# Patient Record
Sex: Male | Born: 1955 | Race: Black or African American | Hispanic: No | Marital: Single | Smoking: Never smoker
Health system: Southern US, Community
[De-identification: ages and names within clinical notes are randomized; demographics above are authoritative.]

---

## 2017-10-01 ENCOUNTER — Encounter: Payer: Self-pay | Admitting: Emergency Medicine

## 2017-10-01 ENCOUNTER — Emergency Department: Payer: Medicare Other

## 2017-10-01 ENCOUNTER — Emergency Department
Admission: EM | Admit: 2017-10-01 | Discharge: 2017-10-01 | Disposition: A | Discharge status: Home / Self Care | Payer: Medicare Other | Attending: Emergency Medicine | Admitting: Emergency Medicine

## 2017-10-01 DIAGNOSIS — Y939 Activity, unspecified: Secondary | ICD-10-CM | POA: Insufficient documentation

## 2017-10-01 DIAGNOSIS — Y92129 Unspecified place in nursing home as the place of occurrence of the external cause: Secondary | ICD-10-CM | POA: Insufficient documentation

## 2017-10-01 DIAGNOSIS — S0993XA Unspecified injury of face, initial encounter: Secondary | ICD-10-CM | POA: Diagnosis present

## 2017-10-01 DIAGNOSIS — Y999 Unspecified external cause status: Secondary | ICD-10-CM | POA: Insufficient documentation

## 2017-10-01 DIAGNOSIS — W19XXXA Unspecified fall, initial encounter: Secondary | ICD-10-CM

## 2017-10-01 DIAGNOSIS — W050XXA Fall from non-moving wheelchair, initial encounter: Secondary | ICD-10-CM | POA: Insufficient documentation

## 2017-10-01 DIAGNOSIS — Z79899 Other long term (current) drug therapy: Secondary | ICD-10-CM | POA: Insufficient documentation

## 2017-10-01 DIAGNOSIS — S0083XA Contusion of other part of head, initial encounter: Secondary | ICD-10-CM | POA: Insufficient documentation

## 2017-10-01 DIAGNOSIS — F0390 Unspecified dementia without behavioral disturbance: Secondary | ICD-10-CM | POA: Diagnosis not present

## 2017-10-01 MED ORDER — KETOROLAC TROMETHAMINE 60 MG/2ML IM SOLN
15.0000 mg | Freq: Once | INTRAMUSCULAR | Status: AC
  Administered 2017-10-01: 15 mg via INTRAMUSCULAR
  Filled 2017-10-01: qty 2

## 2017-10-01 NOTE — Discharge Instructions (Signed)
Your CT scan of the head and neck did not show any serious acute injuries today.  Continue taking your medications and follow up with your doctor this week to continue monitoring your symptoms.

## 2017-10-01 NOTE — ED Provider Notes (Signed)
[facility] Provider Note  ____________________________________________  Time seen: Approximately 9:40 PM  I have reviewed the triage vital signs and the nursing notes.   HISTORY  Chief Complaint Fall  Level 5 Caveat: Portions of the History and Physical were unable to be obtained due to altered mental status due to advanced dementia.   HPI Daniel [facility]erra is a 61 y.o. male sent to the ED due to a reported fall this [facility]. Patient was sitting in a wheelchair, not secured, fell forward out of the wheelchair and hit his face on the ground. No LOC or vomiting. Fall was witnessed. No seizure activity is reported. Patient was kept there at [facility] throughout the day and then sent to the ED this evening for evaluation. He fell onto a carpeted surface and is reported to have a "rug burn" on his left forehead. Doesn't take anticoagulants.     History reviewed. No pertinent past medical history. Dementia Seizures Depression  There are no active problems to display for this patient.    History reviewed. No pertinent surgical history.   Prior to Admission medications   Medication Sig Start Date End Date Taking? Authorizing Provider  Cholecalciferol (VITAMIN D3 SUPER STRENGTH) 2000 units TABS Take 2,000 Units by mouth daily.   Yes [provider]  docusate sodium (COLACE) 100 MG capsule Take 100 mg by mouth 2 (two) times daily.   Yes [provider]  levETIRAcetam (KEPPRA) 100 MG/ML solution Take 1,500 mg by mouth 2 (two) times daily.   Yes [provider]  loratadine (CLARITIN) 10 MG tablet Take 10 mg by mouth daily.   Yes [provider]  polyethylene glycol (MIRALAX / GLYCOLAX) packet Take 17 g by mouth daily.   Yes [provider]  sertraline (ZOLOFT) 50 MG tablet Take 50 mg by mouth daily.   Yes [provider]     Allergies Patient has no known  allergies.   History reviewed. No pertinent family history.  Social History Social History  Substance Use Topics  . Smoking status: Never Smoker  . Smokeless tobacco: Never Used  . Alcohol use No    Review of Systems Unable to reliably obtained due to altered mental status.  ____________________________________________   PHYSICAL EXAM:  VITAL SIGNS: ED Triage Vitals  Enc Vitals Group     BP 10/01/17 2001 128/77     Pulse Rate 10/01/17 2001 (!) 107     Resp 10/01/17 2001 18     Temp 10/01/17 2001 98.7 F (37.1 C)     Temp Source 10/01/17 2001 Oral     SpO2 10/01/17 2001 100 %     Weight 10/01/17 2002 198 lb (89.8 kg)     Height 10/01/17 2002 6\' 1"  (1.854 m)     Head Circumference --      Peak Flow --      Pain Score --      Pain Loc --      Pain Edu? --      Excl. in GC? --     Vital signs reviewed, nursing assessments reviewed.   Constitutional:  Awake, not alert or oriented. Not in distress. Eyes:   No scleral icterus.  EOMI. PERRL. ENT   Head:   Normocephalic with contusion and abrasion of the left forehead   Nose:   No congestion/rhinnorhea. Axis   Mouth/Throat:   MMM, no pharyngeal erythema. No peritonsillar mass. No intraoral injuries   Neck:  No meningismus. Full ROM. No midline spinal tenderness Hematological/Lymphatic/Immunilogical:   No cervical lymphadenopathy. Cardiovascular:   RRR. Symmetric bilateral radial and DP pulses.  No murmurs.  Respiratory:   Normal respiratory effort without tachypnea/retractions. Breath sounds are clear and equal bilaterally. No wheezes/rales/rhonchi. Gastrointestinal:   Soft and nontender. Non distended. There is no CVA tenderness.  No rebound, rigidity, or guarding. Genitourinary:   deferred Musculoskeletal:   Normal range of motion in all extremities. No joint effusions.  No lower extremity tenderness.  No edema. Neurologic:   Nonverbal.  Contractures and extremities No acute focal neurologic deficits  are appreciated.  Skin:    Skin is warm, dry and intact. No rash noted.  No petechiae, purpura, or bullae.  ____________________________________________    LABS (pertinent positives/negatives) (all labs ordered are listed, but only abnormal results are displayed) Labs Reviewed - No data to display ____________________________________________   EKG    ____________________________________________    RADIOLOGY  Ct Head Wo Contrast  Result Date: 10/01/2017 CLINICAL DATA:  Pt sustained the fall some time in the morning and they saw that the pt fell forward from his wheel chair while the Pt was rocking back and forward. According to the witness the Pt fell and hit the floor with his head, no LOC was witnessed EXAM: CT HEAD WITHOUT CONTRAST CT CERVICAL SPINE WITHOUT CONTRAST TECHNIQUE: Multidetector CT imaging of the head and cervical spine was performed following the standard protocol without intravenous contrast. Multiplanar CT image reconstructions of the cervical spine were also generated. COMPARISON:  None. FINDINGS: CT HEAD FINDINGS Brain: No intracranial hemorrhage. No parenchymal contusion. No midline shift or mass effect. Basilar cisterns are patent. No skull base fracture. No fluid in the paranasal sinuses or mastoid air cells. Orbits are normal. Frontotemporal atrophy and ventriculomegaly. The degree of ventriculomegaly appears slightly disproportionate atrophy. Vascular: No hyperdense vessel or unexpected calcification. Skull: Normal. Negative for fracture or focal lesion. Sinuses/Orbits: Paranasal sinuses and mastoid air cells are clear. Orbits are clear. Other: None. CT CERVICAL SPINE FINDINGS Alignment: Focal kyphosis in the mid cervical spine appears chronic. Skull base and vertebrae: Normal craniocervical junction. No loss of bowel vertebral body height or disc height. Normal facet articulation. No evidence of fracture. Soft tissues and spinal canal: No prevertebral soft tissue  swelling. No perispinal or epidural hematoma. Disc levels: Multiple levels of endplate spurring and joint space narrowing from C3-C6. Upper chest: Clear Other: None IMPRESSION: 1. No acute intracranial findings.  No evidence trauma. 2. Marked ventriculomegaly and frontal cortical atrophy. 3. No evidence cervical spine fracture. 4. Multilevel disc osteophytic disease. 5. Focal kyphosis in the mid cervical spine appears chronic. Electronically Signed   By: Genevive Bi M.D.   On: 10/01/2017 21:02   Ct Cervical Spine Wo Contrast  Result Date: 10/01/2017 CLINICAL DATA:  Pt sustained the fall some time in the morning and they saw that the pt fell forward from his wheel chair while the Pt was rocking back and forward. According to the witness the Pt fell and hit the floor with his head, no LOC was witnessed EXAM: CT HEAD WITHOUT CONTRAST CT CERVICAL SPINE WITHOUT CONTRAST TECHNIQUE: Multidetector CT imaging of the head and cervical spine was performed following the standard protocol without intravenous contrast. Multiplanar CT image reconstructions of the cervical spine were also generated. COMPARISON:  None. FINDINGS: CT HEAD FINDINGS Brain: No intracranial hemorrhage. No parenchymal contusion. No midline shift or mass effect. Basilar cisterns are patent. No skull base fracture. No fluid in  the paranasal sinuses or mastoid air cells. Orbits are normal. Frontotemporal atrophy and ventriculomegaly. The degree of ventriculomegaly appears slightly disproportionate atrophy. Vascular: No hyperdense vessel or unexpected calcification. Skull: Normal. Negative for fracture or focal lesion. Sinuses/Orbits: Paranasal sinuses and mastoid air cells are clear. Orbits are clear. Other: None. CT CERVICAL SPINE FINDINGS Alignment: Focal kyphosis in the mid cervical spine appears chronic. Skull base and vertebrae: Normal craniocervical junction. No loss of bowel vertebral body height or disc height. Normal facet articulation. No  evidence of fracture. Soft tissues and spinal canal: No prevertebral soft tissue swelling. No perispinal or epidural hematoma. Disc levels: Multiple levels of endplate spurring and joint space narrowing from C3-C6. Upper chest: Clear Other: None IMPRESSION: 1. No acute intracranial findings.  No evidence trauma. 2. Marked ventriculomegaly and frontal cortical atrophy. 3. No evidence cervical spine fracture. 4. Multilevel disc osteophytic disease. 5. Focal kyphosis in the mid cervical spine appears chronic. Electronically Signed   By: Genevive BiStewart  Edmunds M.D.   On: 10/01/2017 21:02    ____________________________________________   PROCEDURES Procedures  ____________________________________________   DIFFERENTIAL DIAGNOSIS  Stroke, intracranial hemorrhage, cervical spine fracture, skull fracture   CLINICAL IMPRESSION / ASSESSMENT AND PLAN / ED COURSE  Pertinent labs & imaging results that were available during my care of the patient were reviewed by me and considered in my medical decision making (see chart for details).   Patient not in distress, unremarkable vital signs except for slight tachycardia at triage, sent to the ED for evaluation of head injury. Patient is unable to provide any history or participate in exam. CT scans of head and neck unremarkable. Suitable for discharge home and outpatient follow-up. I doubt that the patient has an acute infectious process such as pneumonia or urinary tract infection soft tissue infection or meningitis. I doubt sepsis shock or acute blood loss. No evidence of any other traumatic injury.      ____________________________________________   FINAL CLINICAL IMPRESSION(S) / ED DIAGNOSES    Final diagnoses:  Contusion of face, initial encounter  Fall, initial encounter      New Prescriptions   No medications on file     Portions of this note were generated with dragon dictation software. Dictation errors may occur despite best attempts  at proofreading.    Sharman CheekStafford, Latoshia Monrroy, MD 10/01/17 2144

## 2017-10-01 NOTE — ED Triage Notes (Signed)
Pt arrived to the ED via EMS from [facility] to be evaluated for a fall. According to EMS a family member for another patient at [facility] told them that the Pt sustained the fall some time in the morning and they saw that the pt fell forward from his wheel chair while the Pt was rocking back and forward. According to the witness the Pt fell and hit the floor with his head, no LOC was witnessed. Pt is Alert upon arrival to the hospital but alertness to time and place could not be verified since the Pt is non-verbal due to advanced dementia. Family is at bedside.

## 2018-07-10 ENCOUNTER — Emergency Department (HOSPITAL_COMMUNITY): Payer: Medicare Other

## 2018-07-10 ENCOUNTER — Emergency Department (HOSPITAL_COMMUNITY)
Admission: EM | Admit: 2018-07-10 | Discharge: 2018-07-10 | Disposition: A | Discharge status: Home / Self Care | Payer: Medicare Other | Attending: Emergency Medicine | Admitting: Emergency Medicine

## 2018-07-10 ENCOUNTER — Encounter (HOSPITAL_COMMUNITY): Payer: Self-pay

## 2018-07-10 ENCOUNTER — Other Ambulatory Visit: Payer: Self-pay

## 2018-07-10 DIAGNOSIS — Z79899 Other long term (current) drug therapy: Secondary | ICD-10-CM | POA: Diagnosis not present

## 2018-07-10 DIAGNOSIS — Y939 Activity, unspecified: Secondary | ICD-10-CM | POA: Diagnosis not present

## 2018-07-10 DIAGNOSIS — Y999 Unspecified external cause status: Secondary | ICD-10-CM | POA: Insufficient documentation

## 2018-07-10 DIAGNOSIS — Y92122 Bedroom in nursing home as the place of occurrence of the external cause: Secondary | ICD-10-CM | POA: Diagnosis not present

## 2018-07-10 DIAGNOSIS — S0990XA Unspecified injury of head, initial encounter: Secondary | ICD-10-CM | POA: Diagnosis present

## 2018-07-10 DIAGNOSIS — S022XXA Fracture of nasal bones, initial encounter for closed fracture: Secondary | ICD-10-CM

## 2018-07-10 DIAGNOSIS — G309 Alzheimer's disease, unspecified: Secondary | ICD-10-CM | POA: Diagnosis not present

## 2018-07-10 DIAGNOSIS — S0181XA Laceration without foreign body of other part of head, initial encounter: Secondary | ICD-10-CM | POA: Insufficient documentation

## 2018-07-10 DIAGNOSIS — W06XXXA Fall from bed, initial encounter: Secondary | ICD-10-CM | POA: Insufficient documentation

## 2018-07-10 DIAGNOSIS — W19XXXA Unspecified fall, initial encounter: Secondary | ICD-10-CM

## 2018-07-10 LAB — CBG MONITORING, ED: Glucose-Capillary: 82 mg/dL (ref 70–99)

## 2018-07-10 MED ORDER — MORPHINE SULFATE (PF) 4 MG/ML IV SOLN
4.0000 mg | Freq: Once | INTRAVENOUS | Status: AC
  Administered 2018-07-10: 4 mg via INTRAMUSCULAR
  Filled 2018-07-10: qty 1

## 2018-07-10 NOTE — ED Notes (Signed)
Got patient on the monitor did vitals patient is resting with call bell in reach and nurse at bedside

## 2018-07-10 NOTE — ED Notes (Signed)
Pt daughter will like to be contacted when patient leaves with [facility]. Phone is on board in pts room.

## 2018-07-10 NOTE — ED Notes (Signed)
Pt unable to sign  

## 2018-07-10 NOTE — ED Notes (Signed)
Pt incontinent of stool; pt cleaned w/ soap and water,dried, brief changed

## 2018-07-10 NOTE — ED Notes (Signed)
[facility]

## 2018-07-10 NOTE — ED Triage Notes (Signed)
Pt from [facility] via ems; per staff, CNA was getting pt up from bed when pt fell face forward, striking forehead on floor; unable to establish if pt had loc; per staff, pt is at baseline; nonverbal, arms and legs contracted; c-collar in place; pt's upper lip swollen; Hx alzheimer's, aphasia, contracture, epilepsy; per staff, pt is prescribed percocet, pt received 3.5 mg prior to ems arrival  123/78 HR 87 98% RA CBG 95 RR 18

## 2018-07-10 NOTE — ED Notes (Signed)
Pt CBG 82. Notified Hannie, RN.

## 2018-07-10 NOTE — ED Notes (Signed)
Pt alert in NAD. Report called to facility. Transported by [facility]

## 2018-07-10 NOTE — ED Notes (Signed)
Waiting on PTAR... 

## 2018-07-10 NOTE — ED Provider Notes (Signed)
[facility] EMERGENCY [facility]PARTMENT Provider Note   CSN: [id number] Arrival date & time: 07/10/18  1154     History   Chief Complaint Chief Complaint  Patient presents with  . Fall  . Laceration    HPI Daniel [facility] is a 62 y.o. male.  Level 5 caveat secondary to nonverbal.  [facility]  He was transferred here after falling out of bed while staff was attempting to get him changed up.  Per EMS report he fell forward struck his forehead on the floor.  Unclear if there was LOC.  Patient baseline nonverbal and history is being provided by the note and also his sisters are here.  They say at baseline he is nonverbal noncommunicative contracted and bedbound.  The only thing that they are noticing is a small laceration on his forehead, swollen reddened nose, some bleeding from his mouth, and abrasion on his right knee.  Otherwise they feel that he is at his baseline.  The history is provided by the EMS personnel and a relative. The history is limited by a language barrier.  Fall  This is a new problem. The current episode started 1 to 2 hours ago.  Laceration      History reviewed. No pertinent past medical history.  There are no active problems to display for this patient.   History reviewed. No pertinent surgical history.      Home Medications    Prior to Admission medications   Medication Sig Start Date End Date Taking? Authorizing Provider  bisacodyl (DULCOLAX) 10 MG suppository Place 10 mg rectally as needed for mild constipation.   Yes [provider]  levETIRAcetam (KEPPRA) 100 MG/ML solution Take 1,500 mg by mouth 2 (two) times daily.   Yes [provider]  LORazepam (ATIVAN) 0.5 MG tablet Take 0.5 mg by mouth every 12 (twelve) hours.   Yes [provider]  oxyCODONE-acetaminophen (PERCOCET/ROXICET) 5-325 MG tablet Take 1 tablet by mouth every 6 (six) hours. Take 1 tablet by mouth every 3  hours as needed for  pain   Yes [provider]  senna-docusate (SENOKOT-S) 8.6-50 MG tablet Take 1 tablet by mouth 2 (two) times daily.   Yes [provider]    Family History No family history on file.  Social History Social History   Tobacco Use  . Smoking status: Never Smoker  . Smokeless tobacco: Never Used  Substance Use Topics  . Alcohol use: No  . Drug use: Not on file     Allergies   Patient has no known allergies.   Review of Systems Review of Systems  Unable to perform ROS: Patient nonverbal     Physical Exam Updated Vital Signs BP 128/90   Pulse (!) 103   Temp 98.5 F (36.9 C) (Oral)   Resp 19   SpO2 100%   Physical Exam  Constitutional: He appears well-developed and well-nourished.  HENT:  Head: Normocephalic.  Right Ear: External ear normal.  Left Ear: External ear normal.  He is a small abrasion on his forehead with no active bleeding.  There is no head or skull crepitus.  the bridge of his nose is swollen and reddened.  There is no active nosebleed.  He has some dried blood in his mouth but I am unable to open his mouth to assess if there is any other lacerations.  He is grinding his teeth.  Eyes: Conjunctivae are normal.  Neck: Neck supple.  Cardiovascular: Regular rhythm, normal heart sounds and intact distal pulses.  Pulmonary/Chest: Effort normal. He has no wheezes. He has no rales.  Abdominal: Soft. He exhibits no mass. There is no guarding.  Musculoskeletal:  His upper and left lower extremities are contracted small abrasion over his right knee.  No obvious deformities.  Neurological:  Patient is awake but noncommunicative not following any commands.  He is got upper and lower extremity contractures.  This is baseline per his sisters.  Skin: Skin is warm and dry.  Psychiatric: He has a normal mood and affect.  Nursing note and vitals reviewed.    ED Treatments / Results  Labs (all labs ordered are listed, but only  abnormal results are displayed) Labs Reviewed  CBG MONITORING, ED    EKG None  Radiology Ct Head Wo Contrast  Result Date: 07/10/2018 CLINICAL DATA:  Larey Seat from the bed striking the face and head. EXAM: CT HEAD WITHOUT CONTRAST CT MAXILLOFACIAL WITHOUT CONTRAST CT CERVICAL SPINE WITHOUT CONTRAST TECHNIQUE: Multidetector CT imaging of the head, cervical spine, and maxillofacial structures were performed using the standard protocol without intravenous contrast. Multiplanar CT image reconstructions of the cervical spine and maxillofacial structures were also generated. COMPARISON:  10/01/2017 FINDINGS: CT HEAD FINDINGS Brain: Generalized brain atrophy. Extensive chronic small-vessel ischemic changes of the cerebral hemispheric white matter with ex vacuo enlargement the lateral ventricles. No sign of acute infarction, mass lesion, hemorrhage, hydrocephalus or extra-axial collection. Vascular: No abnormal vascular finding. Skull: No skull fracture. Other: Forehead soft tissue swelling. CT MAXILLOFACIAL FINDINGS Osseous: Minor nasal fracture.  No other facial fracture. Orbits: Negative Sinuses: Chronic inflammatory changes of the right maxillary sinus. Soft tissues: Soft tissue swelling of the nose. CT CERVICAL SPINE FINDINGS Alignment: Kyphotic curvature of the spine. No traumatic malalignment. Skull base and vertebrae: No fracture or focal bone lesion. Soft tissues and spinal canal: Negative Disc levels: Chronic fusion at C4-5. Ordinary spondylosis at C2-3, C3-4 and C5-6. Upper chest: Negative Other: None IMPRESSION: Head CT: No acute or traumatic finding. Pronounced generalized atrophy, unchanged since the previous exam. Facial CT: Nasal fractures. No other facial fracture. Chronic right maxillary sinus inflammation. Cervical spine CT: No acute or traumatic finding. Chronic spondylosis. Electronically Signed   By: Paulina Fusi M.D.   On: 07/10/2018 15:29   Ct Cervical Spine Wo Contrast  Result Date:  07/10/2018 CLINICAL DATA:  Larey Seat from the bed striking the face and head. EXAM: CT HEAD WITHOUT CONTRAST CT MAXILLOFACIAL WITHOUT CONTRAST CT CERVICAL SPINE WITHOUT CONTRAST TECHNIQUE: Multidetector CT imaging of the head, cervical spine, and maxillofacial structures were performed using the standard protocol without intravenous contrast. Multiplanar CT image reconstructions of the cervical spine and maxillofacial structures were also generated. COMPARISON:  10/01/2017 FINDINGS: CT HEAD FINDINGS Brain: Generalized brain atrophy. Extensive chronic small-vessel ischemic changes of the cerebral hemispheric white matter with ex vacuo enlargement the lateral ventricles. No sign of acute infarction, mass lesion, hemorrhage, hydrocephalus or extra-axial collection. Vascular: No abnormal vascular finding. Skull: No skull fracture. Other: Forehead soft tissue swelling. CT MAXILLOFACIAL FINDINGS Osseous: Minor nasal fracture.  No other facial fracture. Orbits: Negative Sinuses: Chronic inflammatory changes of the right maxillary sinus. Soft tissues: Soft tissue swelling of the nose. CT CERVICAL SPINE FINDINGS Alignment: Kyphotic curvature of the spine. No traumatic malalignment. Skull base and vertebrae: No fracture or focal bone lesion. Soft tissues and spinal canal: Negative Disc levels: Chronic fusion at C4-5. Ordinary spondylosis at C2-3, C3-4 and C5-6. Upper chest: Negative Other: None IMPRESSION:  Head CT: No acute or traumatic finding. Pronounced generalized atrophy, unchanged since the previous exam. Facial CT: Nasal fractures. No other facial fracture. Chronic right maxillary sinus inflammation. Cervical spine CT: No acute or traumatic finding. Chronic spondylosis. Electronically Signed   By: Paulina Fusi M.D.   On: 07/10/2018 15:29   Ct Maxillofacial Wo Cm  Result Date: 07/10/2018 CLINICAL DATA:  Larey Seat from the bed striking the face and head. EXAM: CT HEAD WITHOUT CONTRAST CT MAXILLOFACIAL WITHOUT CONTRAST CT  CERVICAL SPINE WITHOUT CONTRAST TECHNIQUE: Multidetector CT imaging of the head, cervical spine, and maxillofacial structures were performed using the standard protocol without intravenous contrast. Multiplanar CT image reconstructions of the cervical spine and maxillofacial structures were also generated. COMPARISON:  10/01/2017 FINDINGS: CT HEAD FINDINGS Brain: Generalized brain atrophy. Extensive chronic small-vessel ischemic changes of the cerebral hemispheric white matter with ex vacuo enlargement the lateral ventricles. No sign of acute infarction, mass lesion, hemorrhage, hydrocephalus or extra-axial collection. Vascular: No abnormal vascular finding. Skull: No skull fracture. Other: Forehead soft tissue swelling. CT MAXILLOFACIAL FINDINGS Osseous: Minor nasal fracture.  No other facial fracture. Orbits: Negative Sinuses: Chronic inflammatory changes of the right maxillary sinus. Soft tissues: Soft tissue swelling of the nose. CT CERVICAL SPINE FINDINGS Alignment: Kyphotic curvature of the spine. No traumatic malalignment. Skull base and vertebrae: No fracture or focal bone lesion. Soft tissues and spinal canal: Negative Disc levels: Chronic fusion at C4-5. Ordinary spondylosis at C2-3, C3-4 and C5-6. Upper chest: Negative Other: None IMPRESSION: Head CT: No acute or traumatic finding. Pronounced generalized atrophy, unchanged since the previous exam. Facial CT: Nasal fractures. No other facial fracture. Chronic right maxillary sinus inflammation. Cervical spine CT: No acute or traumatic finding. Chronic spondylosis. Electronically Signed   By: Paulina Fusi M.D.   On: 07/10/2018 15:29    Procedures Procedures (including critical care time)  Medications Ordered in ED Medications - No data to display   Initial Impression / Assessment and Plan / ED Course  I have reviewed the triage vital signs and the nursing notes.  Pertinent labs & imaging results that were available during my care of the patient  were reviewed by me and considered in my medical decision making (see chart for details).    I informed the sisters the results of the testing. They are comfortable with him going back to the facility.   Final Clinical Impressions(s) / ED Diagnoses   Final diagnoses:  Injury of head, initial encounter  Facial laceration, initial encounter  Closed fracture of nasal bone, initial encounter  Fall, initial encounter    ED Discharge Orders    None       Terrilee Files, MD 07/11/18 [phone]

## 2018-07-10 NOTE — Discharge Instructions (Signed)
He was seen in the emergency department for a fall out of bed.  He had a CAT scan of your head face and neck.  We identified that there was a nasal fracture.  This will generally heal with time.  Please continue general wound care and pain control as needed.  Return if any concerns.

## 2018-07-10 NOTE — ED Notes (Signed)
Reported family's concern about pain

## 2018-08-19 ENCOUNTER — Emergency Department (HOSPITAL_COMMUNITY)
Admission: EM | Admit: 2018-08-19 | Discharge: 2018-08-20 | Disposition: A | Discharge status: Skilled Nursing Facility | Payer: Medicare Other | Attending: Emergency Medicine | Admitting: Emergency Medicine

## 2018-08-19 ENCOUNTER — Emergency Department (HOSPITAL_COMMUNITY): Payer: Medicare Other

## 2018-08-19 DIAGNOSIS — Z043 Encounter for examination and observation following other accident: Secondary | ICD-10-CM | POA: Diagnosis not present

## 2018-08-19 DIAGNOSIS — G40909 Epilepsy, unspecified, not intractable, without status epilepticus: Secondary | ICD-10-CM | POA: Insufficient documentation

## 2018-08-19 DIAGNOSIS — W19XXXA Unspecified fall, initial encounter: Secondary | ICD-10-CM | POA: Insufficient documentation

## 2018-08-19 DIAGNOSIS — Z79899 Other long term (current) drug therapy: Secondary | ICD-10-CM | POA: Diagnosis not present

## 2018-08-19 NOTE — ED Triage Notes (Signed)
[facility]. Pt was found laying prone on the floor. No new signs of trauma on pt. Pt has hx of seizure however no oral trauma or incontinence was witnessed by EMS. Pt is non verbal for his norm. Pt is contorted and unable to walk.

## 2018-08-19 NOTE — ED Provider Notes (Signed)
TIME SEEN: 11:27 PM  CHIEF COMPLAINT: Found down  HPI: Patient is a 62 year old male with history of seizures who is nonverbal at baseline who lives in nursing facility who presents to the emergency department after he was found down.  Unknown if there is any seizure-like activity.  Patient is nonverbal at baseline and unable to tell us if he is having any pain.  ROS: Level 5 caveat for nonverbal status  PAST MEDICAL HISTORY/PAST SURGICAL HISTORY:  No past medical history on file.  MEDICATIONS:  Prior to Admission medications   Medication Sig Start Date End Date Taking? Authorizing Provider  bisacodyl (DULCOLAX) 10 MG suppository Place 10 mg rectally as needed for mild constipation.   Yes [provider]  levETIRAcetam (KEPPRA) 100 MG/ML solution Take 1,500 mg by mouth 2 (two) times daily.   Yes [provider]  LORazepam (ATIVAN) 0.5 MG tablet Take 0.5 mg by mouth every 12 (twelve) hours.   Yes [provider]  oxyCODONE-acetaminophen (PERCOCET/ROXICET) 5-325 MG tablet Take 1 tablet by mouth See admin instructions. Take 1 tablet by mouth every 8 hours and then 1 tablet every 3 hours as needed for pain   Yes [provider]  senna-docusate (SENOKOT-S) 8.6-50 MG tablet Take 1 tablet by mouth 2 (two) times daily.   Yes [provider]    ALLERGIES:  No Known Allergies  SOCIAL HISTORY:  Social History   Tobacco Use  . Smoking status: Never Smoker  . Smokeless tobacco: Never Used  Substance Use Topics  . Alcohol use: No    FAMILY HISTORY: No family history on file.  EXAM: BP (!) 135/119 (BP Location: Left Arm)   Pulse 96   Resp 19   SpO2 95%  CONSTITUTIONAL: Alert.  Nonverbal at baseline. HEAD: Normocephalic; atraumatic EYES: Conjunctivae clear, PERRL, EOMI ENT: normal nose; no rhinorrhea; moist mucous membranes; pharynx without lesions noted; no dental injury; no septal hematoma NECK: Supple, no meningismus, no LAD; no midline  spinal tenderness, step-off or deformity; trachea midline CARD: RRR; S1 and S2 appreciated; no murmurs, no clicks, no rubs, no gallops RESP: Normal chest excursion without splinting or tachypnea; breath sounds clear and equal bilaterally; no wheezes, no rhonchi, no rales; no hypoxia or respiratory distress CHEST:  chest wall stable, no crepitus or ecchymosis or deformity, nontender to palpation; no flail chest ABD/GI: Normal bowel sounds; non-distended; soft, non-tender, no rebound, no guarding; no ecchymosis or other lesions noted PELVIS:  stable, nontender to palpation BACK:  The back appears normal and is non-tender to palpation, there is no CVA tenderness; no midline spinal tenderness, step-off or deformity EXT: Normal ROM in all joints; non-tender to palpation; no edema; normal capillary refill; no cyanosis, no bony tenderness or bony deformity of patient's extremities, no joint effusion, compartments are soft, extremities are warm and well-perfused, no ecchymosis SKIN: Normal color for age and race; warm NEURO: Moves all extremities equally, atrophied extremities, nonverbal at baseline   MEDICAL DECISION MAKING: Patient was found down at his nursing facility.  Has known history of epilepsy.  No witnessed seizure today.  No obvious sign of trauma but will obtain CT of his head and cervical spine today given he is nonverbal.  Will obtain basic labs.  He is on Keppra for seizures.  Appears to be at his neurologic baseline currently.  ED PROGRESS: Patient's labs are reassuring.  CT head and cervical spine show no acute injury.  No seizure-like activity here in the emergency department.  Will discharge  back to his nursing facility.   At this time, I do not feel there is any life-threatening condition present. I have reviewed and discussed all results (EKG, imaging, lab, urine as appropriate) and exam findings with patient/family. I have reviewed nursing notes and appropriate previous records.  I  feel the patient is safe to be discharged home without further emergent workup and can continue workup as an outpatient as needed. Discussed usual and customary return precautions. Patient/family verbalize understanding and are comfortable with this plan.  Outpatient follow-up has been provided if needed. All questions have been answered.      Ward, Layla MawKristen N, DO 08/20/18 0102

## 2018-08-19 NOTE — ED Notes (Signed)
Bed: WA07 Expected date:  Expected time:  Means of arrival:  Comments: EMS 62 yo male hx epilepsy-found face down on padded surface in room-only alert to vocal 126/76 CBG 124-no blood thinners[facility]

## 2018-08-19 NOTE — ED Notes (Signed)
Palpated pt and pt did not respond in any way to palpation. Pt non verbal at baseline and unable to elaborate on condition. Pt has a prior broken nose from a previous fall.

## 2018-08-20 DIAGNOSIS — Z043 Encounter for examination and observation following other accident: Secondary | ICD-10-CM | POA: Diagnosis not present

## 2018-08-20 LAB — I-STAT CHEM 8, ED
BUN: 7 mg/dL — AB (ref 8–23)
CHLORIDE: 105 mmol/L (ref 98–111)
Calcium, Ion: 1.22 mmol/L (ref 1.15–1.40)
Creatinine, Ser: 0.6 mg/dL — ABNORMAL LOW (ref 0.61–1.24)
Glucose, Bld: 78 mg/dL (ref 70–99)
HCT: 43 % (ref 39.0–52.0)
Hemoglobin: 14.6 g/dL (ref 13.0–17.0)
POTASSIUM: 4 mmol/L (ref 3.5–5.1)
SODIUM: 139 mmol/L (ref 135–145)
TCO2: 26 mmol/L (ref 22–32)

## 2018-08-20 NOTE — ED Notes (Signed)
Called [facility] for pt 

## 2018-08-20 NOTE — Discharge Instructions (Addendum)
Labs including blood counts, electrolytes, kidney function were normal today.  CT of the head and cervical spine showed no injury.

## 2018-11-03 DEATH — deceased

## 2020-05-23 IMAGING — CT CT CERVICAL SPINE W/O CM
4 of 7 series · 15 of 33 positions shown, 16 images · non-contrast
Comparison: 07/10/2018

CLINICAL DATA: History of epilepsy. Found face down on padded
surface in room. No blood thinners.

EXAM:
CT HEAD WITHOUT CONTRAST
CT CERVICAL SPINE WITHOUT CONTRAST
TECHNIQUE: Multidetector CT imaging of the head and cervical spine was
performed following the standard protocol without intravenous
contrast. Multiplanar CT image reconstructions of the cervical spine
were also generated.

[Series 6: coronal soft tissue · coronal · 0.31mm/px · 3 of 84 slices shown]
[im 17/84  bone]
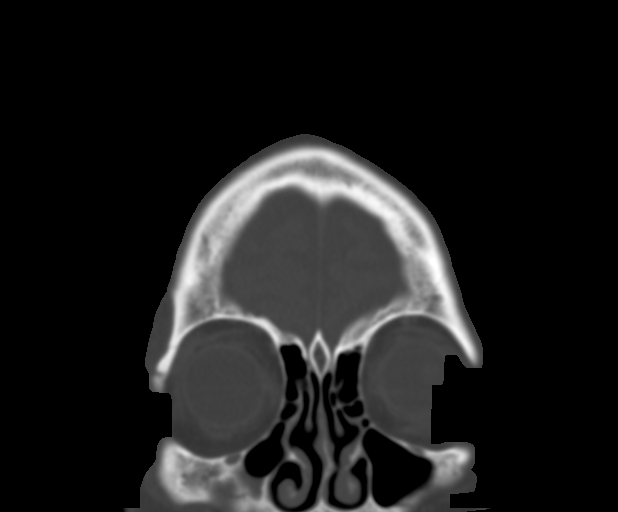
[im 34/84  bone]
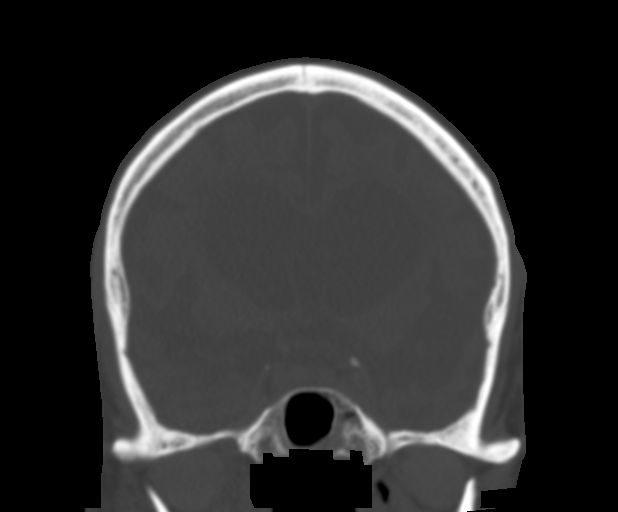
[im 50/84  bone]
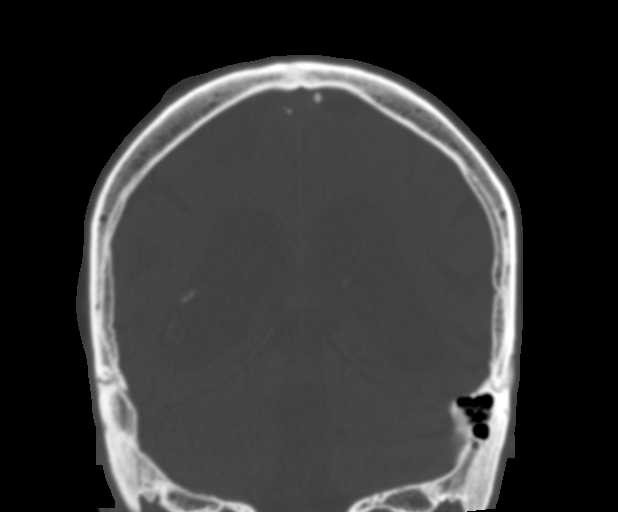

[Series 10: c spine soft · axial · 0.38mm/px · z∈[+1030,+1114]mm · 3 of 85 slices shown]
[im 22/85  soft-tissue]
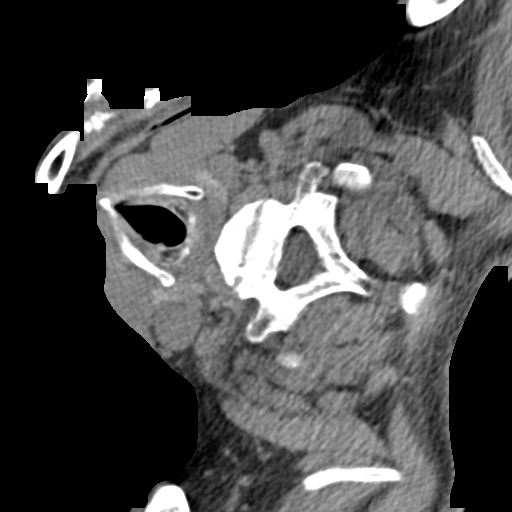
[im 43/85  soft-tissue]
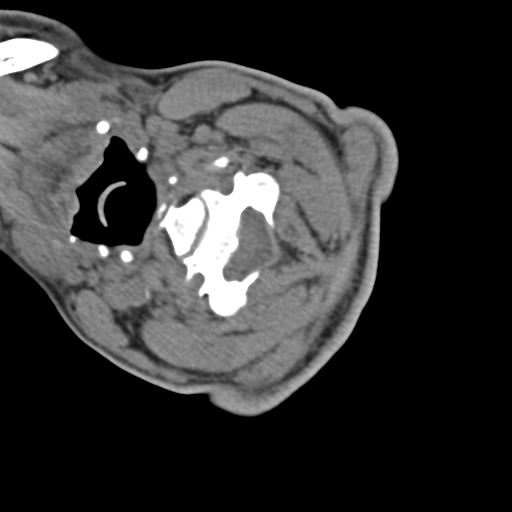
[im 64/85  soft-tissue]
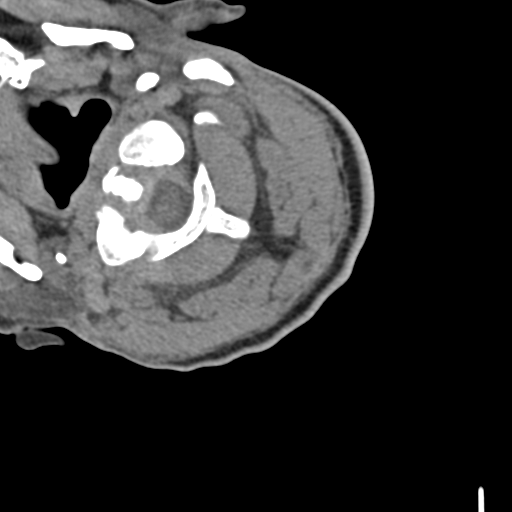

[Series 12: orthogonal bone · axial · 0.23mm/px · z∈[+976,+1087]mm · 4 of 105 slices shown, 5 images]
[im 21/105  soft-tissue]
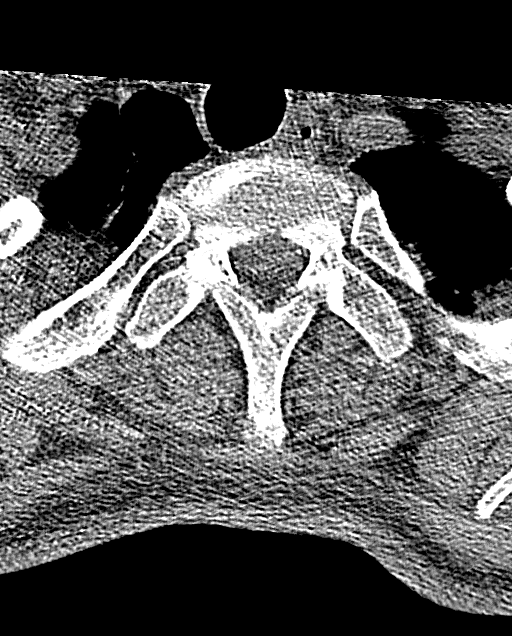
[im 21/105  bone]
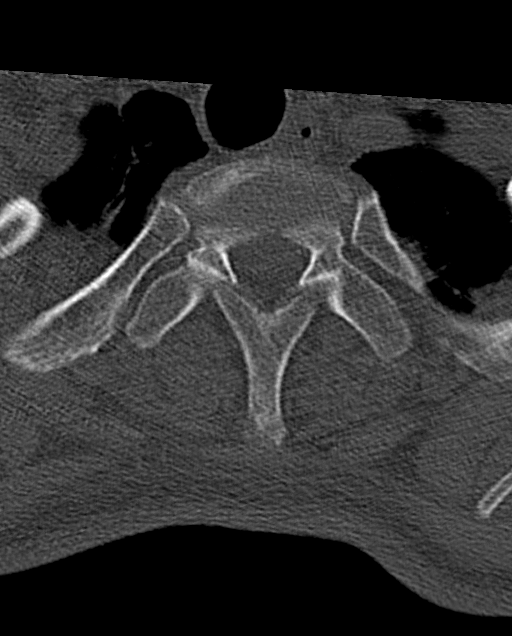
[im 42/105  bone]
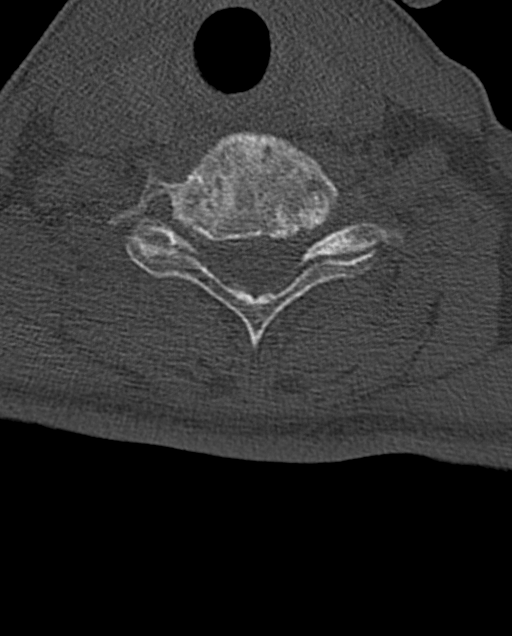
[im 63/105  bone]
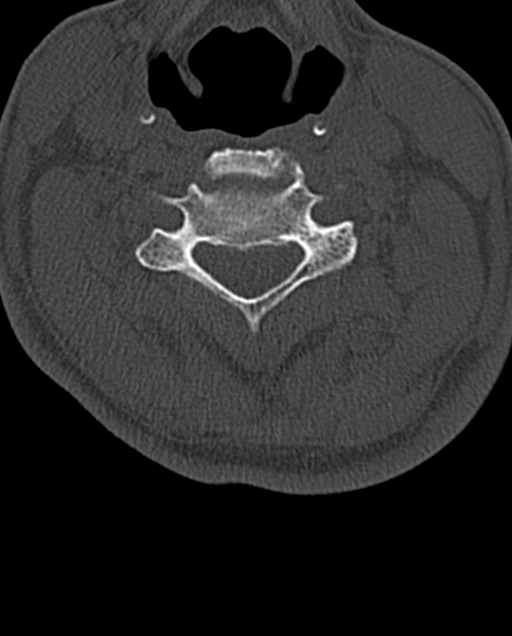
[im 84/105  bone]
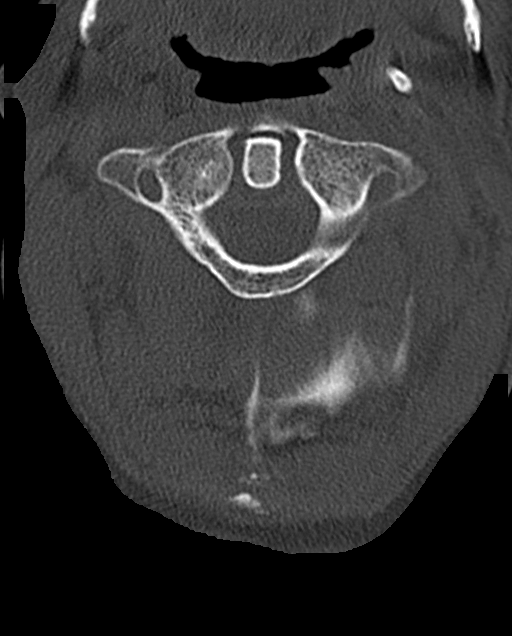

[Series 14: sagittal bone · sagittal · 0.31mm/px · 5 of 61 slices shown]
[im 11/61  bone]
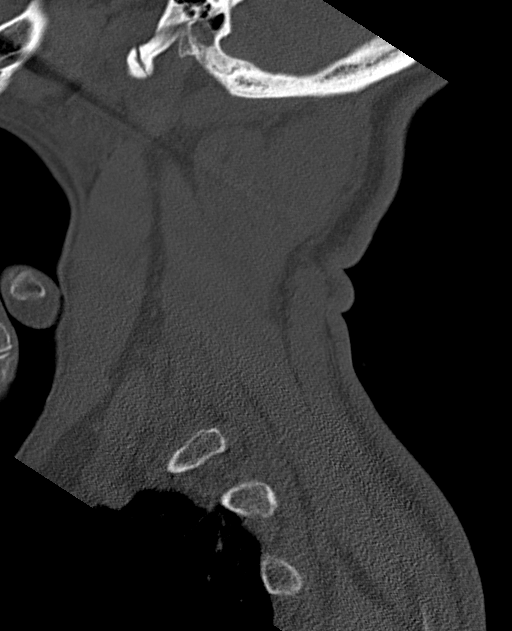
[im 21/61  bone]
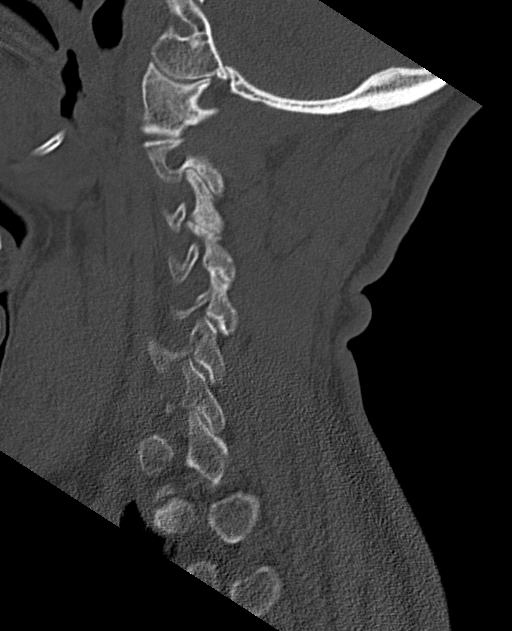
[im 31/61  bone]
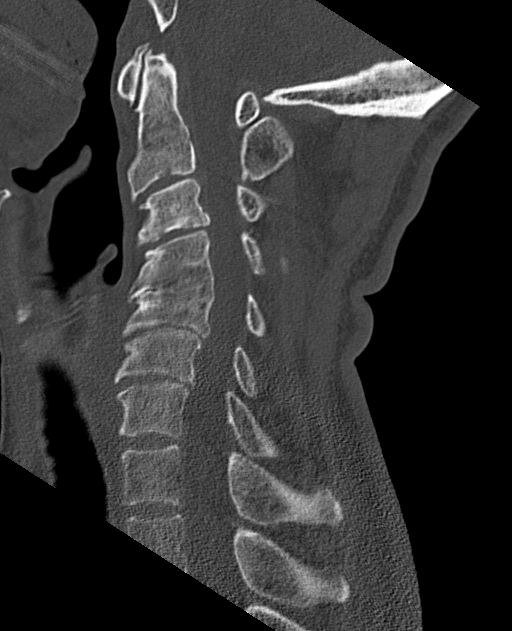
[im 41/61  bone]
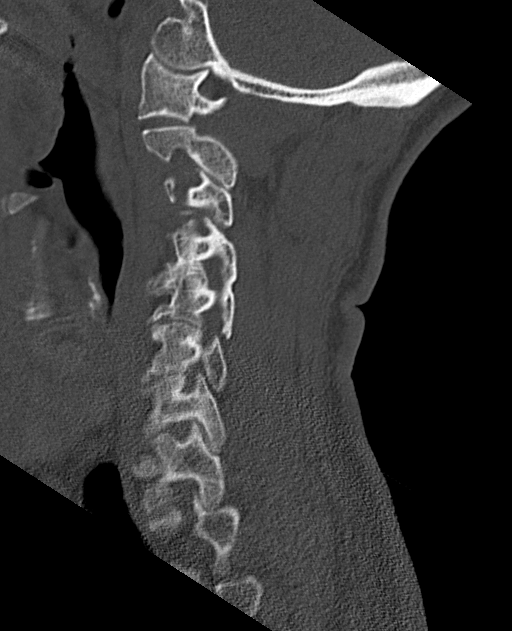
[im 51/61  bone]
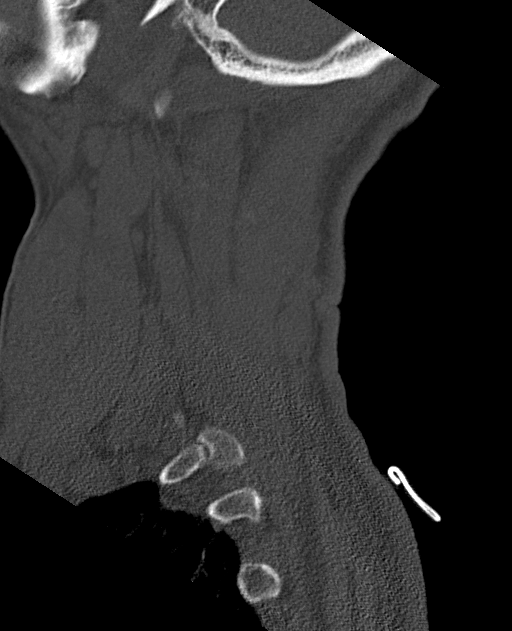

[15 of 33 positions shown; findings below may reference images not displayed]

FINDINGS: CT HEAD FINDINGS

Brain: Diffuse cerebral atrophy. Prominent ventricular dilatation
may reflect central atrophy or other hydrocephalus. Cavum septum
pellucidum. No mass-effect or midline shift. No abnormal extra-axial
fluid collections. Gray-white matter junctions are distinct. Basal
cisterns are not effaced.

Vascular: Mild intracranial arterial vascular calcifications are
present.

Skull: Calvarium appears intact.

Sinuses/Orbits: Mucosal thickening in the right maxillary antrum.
Retention cyst in the left maxillary antrum. No acute air-fluid
levels in the paranasal sinuses. Mastoid air cells are clear.

Other: None.

CT CERVICAL SPINE FINDINGS

Alignment: There is reversal of the usual cervical lordosis centered
at C4-5. This is likely degenerative and is unchanged since previous
study. No anterior subluxation. Normal alignment of the facet
joints. C1-2 articulation appears intact.

Skull base and vertebrae: Skull base appears intact. Mild chronic
anterior compression of C5. No acute vertebral compression. No focal
bone lesion or bone destruction.

Soft tissues and spinal canal: No prevertebral soft tissue swelling.
No abnormal paraspinal soft tissue mass or infiltration.

Disc levels: Degenerative changes throughout the cervical spine with
narrowed interspaces and endplate hypertrophic changes. Coalition at
C4-5 is likely degenerative. Degenerative changes in the facet
joints.

Upper chest: Mild scarring in the lung apices.

Other: Vascular calcifications period.
IMPRESSION: CT head: No acute intracranial abnormalities. Chronic cerebral
atrophy. Prominent ventricular dilatation. No change since prior.

CT cervical spine: Unchanged alignment of the cervical spine.
Degenerative changes. No acute displaced fractures identified.
